# Patient Record
Sex: Male | Born: 1975 | Race: Black or African American | Hispanic: No | Marital: Married | Smoking: Former smoker
Health system: Southern US, Community
[De-identification: ages and names within clinical notes are randomized; demographics above are authoritative.]

## PROBLEM LIST (undated history)

## (undated) DIAGNOSIS — I10 Essential (primary) hypertension: Secondary | ICD-10-CM

## (undated) HISTORY — DX: Essential (primary) hypertension: I10

---

## 1995-11-23 HISTORY — PX: SPLENECTOMY: SUR1306

## 1995-11-23 HISTORY — PX: OTHER SURGICAL HISTORY: SHX169

## 2015-12-03 ENCOUNTER — Encounter: Payer: Self-pay | Admitting: *Deleted

## 2015-12-04 ENCOUNTER — Ambulatory Visit (INDEPENDENT_AMBULATORY_CARE_PROVIDER_SITE_OTHER): Payer: BLUE CROSS/BLUE SHIELD | Admitting: Cardiology

## 2015-12-04 ENCOUNTER — Encounter: Payer: Self-pay | Admitting: *Deleted

## 2015-12-04 ENCOUNTER — Encounter: Payer: Self-pay | Admitting: Cardiology

## 2015-12-04 VITALS — BP 116/76 | HR 94 | Ht 71.0 in | Wt 225.2 lb

## 2015-12-04 DIAGNOSIS — I10 Essential (primary) hypertension: Secondary | ICD-10-CM

## 2015-12-04 DIAGNOSIS — R9431 Abnormal electrocardiogram [ECG] [EKG]: Secondary | ICD-10-CM

## 2015-12-04 DIAGNOSIS — R072 Precordial pain: Secondary | ICD-10-CM

## 2015-12-04 DIAGNOSIS — F17201 Nicotine dependence, unspecified, in remission: Secondary | ICD-10-CM

## 2015-12-04 NOTE — Patient Instructions (Signed)
Your physician recommends that you schedule a follow-up appointment TO BE DETERMINED AFTER TESTING  Your physician has requested that you have a stress echocardiogram. For further information please visit [email or web address]. Please follow instruction sheet as given.  Your physician recommends that you continue on your current medications as directed. Please refer to the Current Medication list given to you today.  Thank you for choosing [facility]!!

## 2015-12-04 NOTE — Progress Notes (Signed)
Cardiology Office Note  Date: 12/04/2015   ID: Marcus Reed, DOB 07-28-1976, MRN [id number]  PCP: Ignatius Specking., MD  Consulting Cardiologist: Nona Dell, MD   Chief Complaint  Patient presents with  . Chest Pain    History of Present Illness: Marcus Reed is a 40 y.o. male referred for cardiology consultation by Dr. Doreen Beam. We discussed his history. He tells me that he presented for a routine health visit, and that while he was waiting in the waiting room to be seen at [facility], began to experience a sharp right-sided chest discomfort that became more intense. He was evaluated with an ECG (reviewed below), and referred for cardiology consultation. He tells been that these symptoms eventually went away on their own while he was there in the office. He has had no prior chest discomfort similar to this, and subsequently has not experienced any chest pain. 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 community [employer] during the daytime. He reports no functional limitations at baseline.  He reports having a history of hypertension for the last 4 years. He did have increased voltage on his ECG. No obvious family history of cardiomyopathy or sudden cardiac death. He states that his father has hypertension.  He does have a previous history of tobacco use but has stopped smoking.  He reports compliance with his medications which we reviewed.  Past Medical History  Diagnosis Date  . Essential hypertension     Past Surgical History  Procedure Laterality Date  . Splenectomy  1997  . Left humerus surgery  1997    Current Outpatient Prescriptions  Medication Sig Dispense Refill  . aspirin-acetaminophen-caffeine (EXCEDRIN MIGRAINE) 250-250-65 MG tablet Take 1 tablet by mouth as needed for headache.    . Azilsartan-Chlorthalidone (EDARBYCLOR) 40-25 MG TABS Take 1 tablet by mouth daily.     . DiphenhydrAMINE HCl (EQ ALLERGY RELIEF PO)  Take 1 tablet by mouth as needed.    . MULTIPLE VITAMIN PO Take 1 tablet by mouth daily.     No current facility-administered medications for this visit.   Allergies:  Review of patient's allergies indicates no known allergies.   Social History: The patient  reports that he has quit smoking. His smoking use included Cigarettes. He does not have any smokeless tobacco history on file. He reports that he drinks alcohol. He reports that he does not use illicit drugs.   Family History: The patient's family history includes Hypertension in his father.   ROS:  Please see the history of present illness. Otherwise, complete review of systems is positive for none.  All other systems are reviewed and negative.   Physical Exam: VS:  BP 116/76 mmHg  Pulse 94  Ht 5\' 11"  (1.803 m)  Wt 225 lb 3.2 oz (102.15 kg)  BMI 31.42 kg/m2  SpO2 95%, BMI Body mass index is 31.42 kg/(m^2).  Wt Readings from Last 3 Encounters:  12/04/15 225 lb 3.2 oz (102.15 kg)  11/28/15 226 lb (102.513 kg)    General: Well-developed male, appears comfortable at rest. HEENT: Conjunctiva and lids normal, oropharynx clear. Neck: Supple, no elevated JVP or carotid bruits, no thyromegaly. Lungs: Clear to auscultation, nonlabored breathing at rest. Cardiac: Regular rate and rhythm, no S3 or significant systolic murmur, no pericardial rub. Abdomen: Soft, nontender, bowel sounds present, no guarding or rebound. Extremities: No pitting edema, distal pulses 2+. Skin: Warm and dry. Musculoskeletal: No kyphosis. Neuropsychiatric: Alert and oriented  x3, affect grossly appropriate.  ECG: [facility] showed sinus rhythm with increased voltage and lead loss in V1 and V2  Assessment and Plan:  1. Episode of atypical chest pain, presently resolved. ECG is abnormal with increased voltage, although potentially related to hypertension. He has a prior history of tobacco use but has stopped smoking. He is referred  for further risk stratification. Plan is to obtain an exercise echocardiogram for further evaluation. If low risk, would focus on risk factor modification and keep follow-up with PCP.  2. Essential hypertension, pressure is well controlled today on current regimen. No changes were made.  Current medicines were reviewed with the patient today.   Orders Placed This Encounter  Procedures  . Echo stress    Disposition: Call with results.   Signed, Jonelle SidleSamuel G. Jaclynn Laumann, MD, [facility] 12/04/2015 3:18 PM    [facility] [address], [place], [place] [address] Phone: [phone]; Fax: [phone]

## 2015-12-19 ENCOUNTER — Ambulatory Visit (HOSPITAL_COMMUNITY)
Admission: RE | Admit: 2015-12-19 | Discharge: 2015-12-19 | Disposition: A | Discharge status: Home / Self Care | Payer: BLUE CROSS/BLUE SHIELD | Source: Ambulatory Visit | Attending: Cardiology | Admitting: Cardiology

## 2015-12-19 DIAGNOSIS — R9431 Abnormal electrocardiogram [ECG] [EKG]: Secondary | ICD-10-CM | POA: Diagnosis present

## 2015-12-19 DIAGNOSIS — R072 Precordial pain: Secondary | ICD-10-CM | POA: Diagnosis not present

## 2015-12-19 LAB — ECHOCARDIOGRAM STRESS TEST
CHL CUP RESTING HR STRESS: 78 {beats}/min
CSEPEDS: 0 s
Estimated workload: 10.1 METS
Exercise duration (min): 9 min
MPHR: 181 {beats}/min
Peak HR: 164 {beats}/min
Percent HR: 90 %
RPE: 9

## 2015-12-24 ENCOUNTER — Telehealth: Payer: Self-pay | Admitting: *Deleted

## 2015-12-24 NOTE — Telephone Encounter (Signed)
Patient informed. 

## 2015-12-24 NOTE — Telephone Encounter (Signed)
-----   Message from Jonelle Sidle, MD sent at 12/22/2015  8:09 AM EST ----- Reviewed report. Please let him know that the stress test was normal, arguing against significant obstructive CAD. No further cardiac testing is anticipated at this time. He should keep follow-up with Dr. Sherril Croon.

## 2016-03-26 ENCOUNTER — Encounter: Payer: Self-pay | Admitting: Vascular Surgery

## 2016-03-31 ENCOUNTER — Ambulatory Visit (INDEPENDENT_AMBULATORY_CARE_PROVIDER_SITE_OTHER): Payer: BLUE CROSS/BLUE SHIELD | Admitting: Vascular Surgery

## 2016-03-31 ENCOUNTER — Encounter: Payer: Self-pay | Admitting: Vascular Surgery

## 2016-03-31 VITALS — BP 154/88 | HR 79 | Temp 97.2°F | Resp 16 | Ht 71.5 in | Wt 218.0 lb

## 2016-03-31 DIAGNOSIS — R2232 Localized swelling, mass and lump, left upper limb: Secondary | ICD-10-CM | POA: Diagnosis not present
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
## 2016-04-01 ENCOUNTER — Encounter: Payer: Self-pay | Admitting: Nurse Practitioner

## 2016-04-06 NOTE — Addendum Note (Signed)
Addended by: Melodye PedMANESS-HARRISON, CHANDA C on: 04/06/2016 11:08 AM   Modules accepted: Orders

## 2016-04-07 ENCOUNTER — Telehealth: Payer: Self-pay | Admitting: Vascular Surgery

## 2016-04-07 NOTE — Telephone Encounter (Signed)
Spoke with pt. CTA 04/15/16 11:45 am 315 - no solids 4 hrs prior. Follow up with Dr. Darrick PennaFields 04/15/16 at 1:45 pm. Pt verbalized understanding.

## 2016-04-12 ENCOUNTER — Encounter: Payer: Self-pay | Admitting: Vascular Surgery

## 2016-04-15 ENCOUNTER — Ambulatory Visit (INDEPENDENT_AMBULATORY_CARE_PROVIDER_SITE_OTHER): Payer: BLUE CROSS/BLUE SHIELD | Admitting: Vascular Surgery

## 2016-04-15 ENCOUNTER — Encounter: Payer: Self-pay | Admitting: Vascular Surgery

## 2016-04-15 ENCOUNTER — Ambulatory Visit
Admission: RE | Admit: 2016-04-15 | Discharge: 2016-04-15 | Disposition: A | Discharge status: Home / Self Care | Payer: BLUE CROSS/BLUE SHIELD | Source: Ambulatory Visit | Attending: Vascular Surgery | Admitting: Vascular Surgery

## 2016-04-15 VITALS — BP 148/90 | HR 75 | Temp 97.7°F | Resp 18 | Ht 71.0 in | Wt 223.0 lb

## 2016-04-15 DIAGNOSIS — R2232 Localized swelling, mass and lump, left upper limb: Secondary | ICD-10-CM

## 2016-04-15 MED ORDER — IOPAMIDOL (ISOVUE-370) INJECTION 76%
80.0000 mL | Freq: Once | INTRAVENOUS | Status: AC | PRN
  Administered 2016-04-15: 80 mL via INTRAVENOUS
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
## 2018-05-17 ENCOUNTER — Other Ambulatory Visit (HOSPITAL_COMMUNITY): Payer: Self-pay | Admitting: Nurse Practitioner

## 2018-05-17 ENCOUNTER — Ambulatory Visit (HOSPITAL_COMMUNITY)
Admission: RE | Admit: 2018-05-17 | Discharge: 2018-05-17 | Disposition: A | Discharge status: Home / Self Care | Payer: BLUE CROSS/BLUE SHIELD | Source: Ambulatory Visit | Attending: Nurse Practitioner | Admitting: Nurse Practitioner

## 2018-05-17 DIAGNOSIS — M7989 Other specified soft tissue disorders: Secondary | ICD-10-CM | POA: Diagnosis not present

## 2018-05-17 DIAGNOSIS — M79604 Pain in right leg: Secondary | ICD-10-CM | POA: Insufficient documentation

## 2018-05-17 DIAGNOSIS — R600 Localized edema: Secondary | ICD-10-CM | POA: Diagnosis not present

## 2018-05-17 DIAGNOSIS — I824Y1 Acute embolism and thrombosis of unspecified deep veins of right proximal lower extremity: Secondary | ICD-10-CM | POA: Diagnosis present

## 2018-05-22 ENCOUNTER — Other Ambulatory Visit: Payer: Self-pay

## 2018-05-22 DIAGNOSIS — M79605 Pain in left leg: Secondary | ICD-10-CM

## 2018-06-26 ENCOUNTER — Encounter

## 2018-07-27 ENCOUNTER — Encounter: Payer: BLUE CROSS/BLUE SHIELD | Admitting: Vascular Surgery

## 2018-07-27 ENCOUNTER — Encounter (HOSPITAL_COMMUNITY): Payer: BLUE CROSS/BLUE SHIELD

## 2018-11-02 ENCOUNTER — Other Ambulatory Visit (HOSPITAL_COMMUNITY): Payer: Self-pay | Admitting: Internal Medicine

## 2018-11-02 DIAGNOSIS — I83813 Varicose veins of bilateral lower extremities with pain: Secondary | ICD-10-CM

## 2018-11-06 ENCOUNTER — Ambulatory Visit (HOSPITAL_COMMUNITY)
Admission: RE | Admit: 2018-11-06 | Discharge: 2018-11-06 | Disposition: A | Discharge status: Home / Self Care | Payer: BLUE CROSS/BLUE SHIELD | Source: Ambulatory Visit | Attending: Family | Admitting: Family

## 2018-11-06 DIAGNOSIS — I83813 Varicose veins of bilateral lower extremities with pain: Secondary | ICD-10-CM | POA: Diagnosis present

## 2018-11-07 ENCOUNTER — Encounter: Payer: Self-pay | Admitting: Nurse Practitioner

## 2018-12-05 ENCOUNTER — Other Ambulatory Visit: Payer: Self-pay

## 2018-12-05 ENCOUNTER — Ambulatory Visit (INDEPENDENT_AMBULATORY_CARE_PROVIDER_SITE_OTHER): Payer: BLUE CROSS/BLUE SHIELD | Admitting: Vascular Surgery

## 2018-12-05 ENCOUNTER — Encounter: Payer: Self-pay | Admitting: Vascular Surgery

## 2018-12-05 VITALS — BP 142/94 | HR 83 | Resp 18 | Ht 71.0 in | Wt 225.0 lb

## 2018-12-05 DIAGNOSIS — I872 Venous insufficiency (chronic) (peripheral): Secondary | ICD-10-CM | POA: Insufficient documentation
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
## 2018-12-11 DIAGNOSIS — I868 Varicose veins of other specified sites: Secondary | ICD-10-CM

## 2019-03-07 ENCOUNTER — Ambulatory Visit: Payer: BLUE CROSS/BLUE SHIELD | Admitting: Vascular Surgery

## 2019-05-29 ENCOUNTER — Telehealth (HOSPITAL_COMMUNITY): Payer: Self-pay | Admitting: Rehabilitation

## 2019-05-29 NOTE — Telephone Encounter (Signed)

## 2019-05-30 ENCOUNTER — Encounter: Payer: Self-pay | Admitting: Vascular Surgery

## 2019-05-30 ENCOUNTER — Ambulatory Visit: Payer: BC Managed Care – PPO | Admitting: Vascular Surgery

## 2019-05-30 ENCOUNTER — Other Ambulatory Visit: Payer: Self-pay

## 2019-05-30 VITALS — BP 137/90 | HR 83 | Temp 98.8°F | Resp 18 | Ht 71.0 in | Wt 224.0 lb

## 2019-05-30 DIAGNOSIS — I83813 Varicose veins of bilateral lower extremities with pain: Secondary | ICD-10-CM | POA: Diagnosis not present

## 2019-05-30 NOTE — Progress Notes (Signed)
Patient name: Marcus Reed MRN: [id number] DOB: 04/12/1976 Sex: male  HPI: Marcus Reed is a 43 y.o. male, returns today for follow-up for symptomatic varicose veins with pain and swelling in both lower extremities.  He was seen by my partner Dr. Carlis Abbott January 2020.  He was given a prescription for compression stockings at that time.  He has been compliant wearing the compression stockings and states he has even added Ace wraps on to them to try to get some relief from his symptoms.  His primary symptoms are heaviness fullness and aching and swelling in both lower extremities.  He states that the compression helps a little bit but does not completely resolve his symptoms.  Other medical problems include hypertension which has been stable.  Past Medical History:  Diagnosis Date  . Essential hypertension    Past Surgical History:  Procedure Laterality Date  . Left humerus surgery  1997  . SPLENECTOMY  1997    Family History  Problem Relation Age of Onset  . Hypertension Father     SOCIAL HISTORY: Social History   Socioeconomic History  . Marital status: Married    Spouse name: Not on file  . Number of children: Not on file  . Years of education: Not on file  . Highest education level: Not on file  Occupational History  . Not on file  Social Needs  . Financial resource strain: Not on file  . Food insecurity    Worry: Not on file    Inability: Not on file  . Transportation needs    Medical: Not on file    Non-medical: Not on file  Tobacco Use  . Smoking status: Former Smoker    Types: Cigarettes  . Smokeless tobacco: Never Used  Substance and Sexual Activity  . Alcohol use: Yes    Alcohol/week: 0.0 standard drinks    Comment: Occasional  . Drug use: No  . Sexual activity: Yes    Partners: Female  Lifestyle  . Physical activity    Days per week: Not on file    Minutes per session: Not on file  . Stress: Not on file  Relationships  . Social Herbalist  on phone: Not on file    Gets together: Not on file    Attends religious service: Not on file    Active member of club or organization: Not on file    Attends meetings of clubs or organizations: Not on file    Relationship status: Not on file  . Intimate partner violence    Fear of current or ex partner: Not on file    Emotionally abused: Not on file    Physically abused: Not on file    Forced sexual activity: Not on file  Other Topics Concern  . Not on file  Social History Narrative  . Not on file    No Known Allergies  Current Outpatient Medications  Medication Sig Dispense Refill  . Azilsartan-Chlorthalidone (EDARBYCLOR) 40-25 MG TABS Take 1 tablet by mouth daily.     . DiphenhydrAMINE HCl (EQ ALLERGY RELIEF PO) Take 1 tablet by mouth as needed.    Marland Kitchen aspirin-acetaminophen-caffeine (EXCEDRIN MIGRAINE) 250-250-65 MG tablet Take 1 tablet by mouth as needed for headache.     No current facility-administered medications for this visit.     ROS:   General:  No weight loss, Fever, chills  Cardiac: No recent episodes of chest pain/pressure, no shortness of breath at  rest.  No shortness of breath with exertion.  Denies history of atrial fibrillation or irregular heartbeat  Vascular: No history of rest pain in feet.  No history of claudication.  No history of non-healing ulcer, No history of DVT   Pulmonary: No home oxygen, no productive cough, no hemoptysis,  No asthma or wheezing   Physical Examination  Vitals:   05/30/19 1522  BP: 137/90  Pulse: 83  Resp: 18  Temp: 98.8 F (37.1 C)  TempSrc: Temporal  SpO2: 98%  Weight: 224 lb (101.6 kg)  Height: 5\' 11"  (1.803 m)    Body mass index is 31.24 kg/m.  General:  Alert and oriented, no acute distress HEENT: Normal Neck: No JVD Cardiac: Regular Rate and Rhythm  Skin: No rash, large clusters of varicosities diffusely through the right thigh and upper inner calf right leg.  Similar findings left leg although not as  large.       Extremity Pulses:  2+ radial, brachial, femoral, dorsalis pedis, posterior tibial pulses bilaterally Musculoskeletal: No deformity trace pedal and pretibial edema laterally  Neurologic: Upper and lower extremity motor 5/5 and symmetric  DATA:  I reviewed the patient's previous venous reflux exam from January 2020.  This showed diffuse reflux of the greater saphenous vein bilaterally with 5 mm great saphenous vein  ASSESSMENT: Symptomatic varicose veins bilateral lower extremities right greater than left.   PLAN: I believe the patient would benefit from laser ablation of the right greater saphenous vein with greater than 20 stab avulsions.  We will work on getting re-insurance preapproval for this.  At some point in the future we would also consider laser ablation stab avulsions for the left leg.  We will see how he does after his first procedure.  He will continue to wear his compression stockings meanwhile.   Fabienne Brunsharles Blayre Papania, MD [facility] [place] Office: [phone] Pager: [phone]

## 2019-06-07 ENCOUNTER — Other Ambulatory Visit: Payer: Self-pay | Admitting: *Deleted

## 2019-06-07 ENCOUNTER — Encounter: Payer: Self-pay | Admitting: Vascular Surgery

## 2019-06-07 DIAGNOSIS — I83813 Varicose veins of bilateral lower extremities with pain: Secondary | ICD-10-CM

## 2019-09-19 ENCOUNTER — Other Ambulatory Visit: Payer: Self-pay | Admitting: *Deleted

## 2019-09-19 MED ORDER — LORAZEPAM 1 MG PO TABS: 1.0000 mg | ORAL_TABLET | ORAL | 0 refills | Status: DC

## 2019-09-26 ENCOUNTER — Other Ambulatory Visit: Payer: Self-pay

## 2019-09-26 ENCOUNTER — Ambulatory Visit: Payer: BC Managed Care – PPO | Admitting: Vascular Surgery

## 2019-09-26 ENCOUNTER — Encounter: Payer: Self-pay | Admitting: Vascular Surgery

## 2019-09-26 VITALS — BP 131/87 | HR 83 | Temp 98.3°F | Resp 16 | Ht 71.0 in | Wt 224.0 lb

## 2019-09-26 DIAGNOSIS — I83811 Varicose veins of right lower extremities with pain: Secondary | ICD-10-CM | POA: Diagnosis not present

## 2019-09-26 HISTORY — PX: ENDOVENOUS ABLATION SAPHENOUS VEIN W/ LASER: SUR449

## 2019-09-26 NOTE — Progress Notes (Signed)
     Laser Ablation Procedure    Date: 09/26/2019   Dion Body DOB:07-24-1976  Consent signed: Yes    Surgeon: Ruta Hinds MD   Procedure: Laser Ablation: right Greater Saphenous Vein  BP 131/87 (BP Location: Left Arm, Patient Position: Sitting, Cuff Size: Large)   Pulse 83   Temp 98.3 F (36.8 C) (Temporal)   Resp 16   Ht 5\' 11"  (1.803 m)   Wt 224 lb (101.6 kg)   SpO2 99%   BMI 31.24 kg/m   Tumescent Anesthesia: 600 cc 0.9% NaCl with 50 cc Lidocaine HCL 1%  and 15 cc 8.4% NaHCO3  Local Anesthesia: 12 cc Lidocaine HCL and NaHCO3 (ratio 2:1)  15 watts continuous mode        Total energy: 3023 Joules   Total time: 3:21  Laser Fiber Ref. # [id number]      Lot # [id number]   Stab Phlebectomy: >20 Sites: Thigh and Calf  Patient tolerated procedure well  Notes: Patient wore face mask.  All staff members wore facial masks and facial shields/goggles.  Ativan 1 mg taken by Mr. Highfill on 09-26-2019 at 9:30 AM and at 11:15 AM.    Description of Procedure:  After marking the course of the secondary varicosities, the patient was placed on the operating table in the supine position, and the right leg was prepped and draped in sterile fashion.   Local anesthetic was administered and under ultrasound guidance the saphenous vein was accessed with a micro needle and guide wire; then the mirco puncture sheath was placed.  A guide wire was inserted saphenofemoral junction , followed by a 5 french sheath.  The position of the sheath and then the laser fiber below the junction was confirmed using the ultrasound.  Tumescent anesthesia was administered along the course of the saphenous vein using ultrasound guidance. The patient was placed in Trendelenburg position and protective laser glasses were placed on patient and staff, and the laser was fired at 15 watts continuous mode advancing 1-51mm/second for a total of 3023 joules.   For stab phlebectomies, local anesthetic was administered at the  previously marked varicosities, and tumescent anesthesia was administered around the vessels.  Greater than 20 stab wounds were made using the tip of an 11 blade. And using the vein hook, the phlebectomies were performed using a hemostat to avulse the varicosities.  Adequate hemostasis was achieved.     Steri strips were applied to the stab wounds and ABD pads and thigh high compression stockings were applied.  Ace wrap bandages were applied over the phlebectomy sites and at the top of the saphenofemoral junction. Blood loss was less than 15 cc.  Discharge instructions reviewed with patient and hardcopy of discharge instructions given to patient to take home. The patient ambulated out of the operating room having tolerated the procedure well.  Ruta Hinds, MD [facility] [place] Office: [phone]

## 2019-09-27 ENCOUNTER — Encounter: Payer: Self-pay | Admitting: Vascular Surgery

## 2019-10-02 ENCOUNTER — Telehealth (HOSPITAL_COMMUNITY): Payer: Self-pay | Admitting: *Deleted

## 2019-10-02 NOTE — Telephone Encounter (Signed)

## 2019-10-03 ENCOUNTER — Encounter: Payer: Self-pay | Admitting: Vascular Surgery

## 2019-10-03 ENCOUNTER — Other Ambulatory Visit: Payer: Self-pay

## 2019-10-03 ENCOUNTER — Ambulatory Visit (HOSPITAL_COMMUNITY)
Admission: RE | Admit: 2019-10-03 | Discharge: 2019-10-03 | Disposition: A | Discharge status: Home / Self Care | Payer: BC Managed Care – PPO | Source: Ambulatory Visit | Attending: Vascular Surgery | Admitting: Vascular Surgery

## 2019-10-03 ENCOUNTER — Ambulatory Visit (INDEPENDENT_AMBULATORY_CARE_PROVIDER_SITE_OTHER): Payer: Self-pay | Admitting: Vascular Surgery

## 2019-10-03 VITALS — BP 149/96 | HR 71 | Temp 98.2°F | Resp 14 | Ht 71.0 in | Wt 213.3 lb

## 2019-10-03 DIAGNOSIS — I83813 Varicose veins of bilateral lower extremities with pain: Secondary | ICD-10-CM | POA: Diagnosis not present

## 2019-10-03 NOTE — Progress Notes (Signed)
Patient is a 43 year old male who returns today for postoperative follow-up after laser ablation and stab avulsions of the right leg.  He still has some soreness and bruising around the area of treatment.  He has no shortness of breath or chest pain.  He has had no incisional drainage.  He does still have significant varicosities in the left lower extremity and is expressing interest in having laser ablation and stab avulsions of the left leg as well.  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  Physical exam:  Vitals:   10/03/19 1051  BP: (!) 149/96  Pulse: 71  Resp: 14  Temp: 98.2 F (36.8 C)  TempSrc: Temporal  SpO2: 98%  Weight: 213 lb 4.8 oz (96.8 kg)  Height: 5\' 11"  (1.803 m)    Healing incisions right leg mild ecchymosis tenderness especially over the lateral aspect of the leg  Left lower extremity multiple varicosities as depicted below    Data: Patient had a duplex ultrasound today which shows successful closure of the right greater saphenous vein no evidence of DVT   Also reviewed the patient's previous reflux exam from December 2019.  This shows diffuse reflux in the left greater saphenous vein with a vein diameter 5 to 6 mm.  Assessment: #1 status post laser ablation stab avulsions right leg slowly improving with still some residual pain and inflammation.  Patient was encouraged to elevate his leg use ice packs continue ibuprofen for a few more weeks.  Left lower extremity symptomatic varicose veins with pain and swelling and some changes of the skin consistent with varicose veins  Plan: #1 continue to elevate right leg continue ibuprofen and ice packs  2.  Patient will be scheduled for laser ablation and stab avulsions left leg for late December 2020.  Marcus Hinds, MD [facility] [place] Office: [phone]

## 2019-10-16 ENCOUNTER — Other Ambulatory Visit: Payer: Self-pay | Admitting: *Deleted

## 2019-10-16 DIAGNOSIS — I83813 Varicose veins of bilateral lower extremities with pain: Secondary | ICD-10-CM

## 2019-11-20 ENCOUNTER — Other Ambulatory Visit: Payer: Self-pay | Admitting: *Deleted

## 2019-11-20 MED ORDER — LORAZEPAM 1 MG PO TABS: 1.0000 mg | ORAL_TABLET | ORAL | 0 refills | Status: DC

## 2019-11-20 NOTE — Telephone Encounter (Signed)
Opened up incorrect tab for ordering medication.

## 2019-11-28 ENCOUNTER — Encounter: Payer: Self-pay | Admitting: Vascular Surgery

## 2019-11-28 ENCOUNTER — Ambulatory Visit: Payer: BC Managed Care – PPO | Admitting: Vascular Surgery

## 2019-11-28 ENCOUNTER — Other Ambulatory Visit: Payer: Self-pay

## 2019-11-28 VITALS — BP 145/92 | HR 92 | Temp 98.2°F | Resp 18 | Ht 71.0 in | Wt 223.0 lb

## 2019-11-28 DIAGNOSIS — I83812 Varicose veins of left lower extremities with pain: Secondary | ICD-10-CM | POA: Diagnosis not present

## 2019-11-28 HISTORY — PX: ENDOVENOUS ABLATION SAPHENOUS VEIN W/ LASER: SUR449

## 2019-11-28 NOTE — Progress Notes (Signed)
     Laser Ablation Procedure    Date: 11/28/2019   [name] DOB:1976-03-25  Consent signed: Yes    Surgeon: Fabienne Bruns MD   Procedure: Laser Ablation: left Greater Saphenous Vein  BP (!) 145/92 (BP Location: Right Arm, Patient Position: Sitting, Cuff Size: Normal)   Pulse 92   Temp 98.2 F (36.8 C) (Temporal)   Resp 18   Ht 5\' 11"  (1.803 m)   Wt 223 lb (101.2 kg)   SpO2 100%   BMI 31.10 kg/m   Tumescent Anesthesia: 600 cc 0.9% NaCl with 50 cc Lidocaine HCL 1%  and 15 cc 8.4% NaHCO3  Local Anesthesia: 18 cc Lidocaine HCL and NaHCO3 (ratio 2:1)  15 watts continuous mode        Total energy: 2871 Joules   Total time: 3:11  Laser Fiber Ref. #      Lot # [id number]   Stab Phlebectomy: >20 Sites: Thigh and Calf  Patient tolerated procedure well  Notes: Patient wore face mask.  All staff members wore facial masks and facial shields/goggles.  Mr. Finch took Ativan 1 mg on 11-28-2019 at 9:10 AM and at 10:30 AM.    Description of Procedure:  After marking the course of the secondary varicosities, the patient was placed on the operating table in the supine position, and the left leg was prepped and draped in sterile fashion.   Local anesthetic was administered and under ultrasound guidance the saphenous vein was accessed with a micro needle and guide wire; then the mirco puncture sheath was placed.  A guide wire was inserted saphenofemoral junction , followed by a 5 french sheath.  The position of the sheath and then the laser fiber below the junction was confirmed using the ultrasound.  Tumescent anesthesia was administered along the course of the saphenous vein using ultrasound guidance. The patient was placed in Trendelenburg position and protective laser glasses were placed on patient and staff, and the laser was fired at 15 watts continuous mode advancing 1-77mm/second for a total of 2871 joules.   For stab phlebectomies, local anesthetic was administered at the  previously marked varicosities, and tumescent anesthesia was administered around the vessels.  Greater than 20 stab wounds were made using the tip of an 11 blade. And using the vein hook, the phlebectomies were performed using a hemostat to avulse the varicosities.  Adequate hemostasis was achieved.     Steri strips were applied to the stab wounds and ABD pads and thigh high compression stockings were applied.  Ace wrap bandages were applied over the phlebectomy sites and at the top of the saphenofemoral junction. Blood loss was less than 15 cc.  Discharge instructions reviewed with patient and hardcopy of discharge instructions given to patient to take home. The patient ambulated out of the operating room having tolerated the procedure well.  3m, MD [facility] [place] Office: [phone]

## 2019-11-29 ENCOUNTER — Encounter: Payer: Self-pay | Admitting: Vascular Surgery

## 2019-12-05 ENCOUNTER — Ambulatory Visit (HOSPITAL_COMMUNITY): Payer: BC Managed Care – PPO

## 2019-12-05 ENCOUNTER — Ambulatory Visit: Payer: BC Managed Care – PPO | Admitting: Vascular Surgery

## 2019-12-11 ENCOUNTER — Telehealth (HOSPITAL_COMMUNITY): Payer: Self-pay

## 2019-12-11 NOTE — Telephone Encounter (Signed)

## 2019-12-12 ENCOUNTER — Ambulatory Visit (HOSPITAL_COMMUNITY)
Admission: RE | Admit: 2019-12-12 | Discharge: 2019-12-12 | Disposition: A | Discharge status: Home / Self Care | Payer: BC Managed Care – PPO | Source: Ambulatory Visit | Attending: Vascular Surgery | Admitting: Vascular Surgery

## 2019-12-12 ENCOUNTER — Encounter: Payer: Self-pay | Admitting: Vascular Surgery

## 2019-12-12 ENCOUNTER — Ambulatory Visit (INDEPENDENT_AMBULATORY_CARE_PROVIDER_SITE_OTHER): Payer: Self-pay | Admitting: Vascular Surgery

## 2019-12-12 ENCOUNTER — Other Ambulatory Visit: Payer: Self-pay

## 2019-12-12 VITALS — BP 137/91 | HR 87 | Temp 98.4°F | Resp 18 | Ht 71.0 in | Wt 223.0 lb

## 2019-12-12 DIAGNOSIS — I83813 Varicose veins of bilateral lower extremities with pain: Secondary | ICD-10-CM | POA: Diagnosis present

## 2019-12-12 NOTE — Progress Notes (Signed)
Patient is a 44 year old male who returns for follow-up today.  He underwent laser ablation of his greater saphenous September 26, 2019.  He also had greater than 20 stab phlebectomies during that setting.  He subsequently underwent laser ablation of his left greater saphenous vein greater than 20 stab phlebectomies November 28, 2019.  He returns today for postoperative follow-up.  States his left leg symptoms have improved.  He still complains of pain over the right lateral aspect of his calf fullness heaviness and aching which is not relieved by elevation or compression stockings in the right leg.  He does not have shortness of breath.  He has not had hemoptysis.  Past Medical History:  Diagnosis Date  . Essential hypertension     Past Surgical History:  Procedure Laterality Date  . ENDOVENOUS ABLATION SAPHENOUS VEIN W/ LASER Right 09/26/2019   endovenous laser ablation right greater saphenous vein and stab phlebectomy > 20 incisions right leg by Fabienne Bruns MD   . ENDOVENOUS ABLATION SAPHENOUS VEIN W/ LASER Left 11/28/2019   endovenous laser ablation left greater saphenous vein and stab phlebectomy > 20 incisions left leg by Fabienne Bruns MD   . Left humerus surgery  1997  . SPLENECTOMY  1997    Current Outpatient Medications on File Prior to Visit  Medication Sig Dispense Refill  . aspirin-acetaminophen-caffeine (EXCEDRIN MIGRAINE) 250-250-65 MG tablet Take 1 tablet by mouth as needed for headache.    . Azilsartan-Chlorthalidone (EDARBYCLOR) 40-25 MG TABS Take 1 tablet by mouth daily.     . DiphenhydrAMINE HCl (EQ ALLERGY RELIEF PO) Take 1 tablet by mouth as needed.    Marland Kitchen LORazepam (ATIVAN) 1 MG tablet Take 1 tablet (1 mg total) by mouth as directed. Take 1 tablet prior to leaving house on day of procedure.  Bring second tablet with you to the office. (Patient not taking: Reported on 12/12/2019) 2 tablet 0  . LORazepam (ATIVAN) 1 MG tablet Take 1 tablet (1 mg total) by mouth as directed.  Take 1 tablet 30 to 60 minutes prior to the procedure and bring second tablet in with you to office. (Patient not taking: Reported on 12/12/2019) 2 tablet 0   No current facility-administered medications on file prior to visit.    Physical exam:  Vitals:   12/12/19 1030  BP: (!) 137/91  Pulse: 87  Resp: 18  Temp: 98.4 F (36.9 C)  TempSrc: Temporal  SpO2: 99%  Weight: 223 lb (101.2 kg)  Height: 5\' 11"  (1.803 m)    Right lower extremity: Well-healed stab avulsion incisions I repeated portions of his ultrasound on his right leg with the SonoSite today which shows an anterior accessory vein which measures 5 mm in the proximal thigh tapering down to 3 mm in the area just above the knee.  Left lower extremity well-healed stab incisions  Data: Patient had a duplex ultrasound of both lower extremities today which showed good closure of the left greater saphenous vein within 7 mm of the saphenofemoral junction.  In the right leg he was noted to have an accessory anterior saphenous branch measuring 5 to 6 mm in diameter with reflux and communication to some varicosities on the lateral aspect of his right leg.   Assessment: Symptomatic varicose veins with pain.  Right leg still has persistent symptoms despite closure of his right greater saphenous vein and has a patent anterior accessory with reflux.  Left leg symptoms have now resolved.  Plan: I discussed with the patient today possible laser  ablation of the anterior accessory saphenous in his right leg for improvement of his symptoms.  He will continue to wear his compression stockings.  We will call him pending insurance approval for the procedure.  Ruta Hinds, MD [facility] [place] Office: [phone]

## 2019-12-14 ENCOUNTER — Encounter: Payer: Self-pay | Admitting: Vascular Surgery

## 2019-12-25 ENCOUNTER — Other Ambulatory Visit: Payer: Self-pay | Admitting: *Deleted

## 2019-12-25 DIAGNOSIS — I83811 Varicose veins of right lower extremities with pain: Secondary | ICD-10-CM

## 2019-12-25 MED ORDER — LORAZEPAM 1 MG PO TABS: ORAL_TABLET | ORAL | 0 refills | Status: DC

## 2019-12-27 ENCOUNTER — Telehealth (HOSPITAL_COMMUNITY): Payer: Self-pay

## 2019-12-27 NOTE — Telephone Encounter (Signed)

## 2019-12-28 ENCOUNTER — Ambulatory Visit: Payer: BC Managed Care – PPO | Admitting: Vascular Surgery

## 2019-12-28 ENCOUNTER — Encounter: Payer: Self-pay | Admitting: Vascular Surgery

## 2019-12-28 ENCOUNTER — Other Ambulatory Visit: Payer: Self-pay

## 2019-12-28 VITALS — BP 136/91 | HR 87 | Temp 98.4°F | Resp 16 | Ht 71.0 in | Wt 220.0 lb

## 2019-12-28 DIAGNOSIS — I83811 Varicose veins of right lower extremities with pain: Secondary | ICD-10-CM

## 2019-12-28 HISTORY — PX: ENDOVENOUS ABLATION SAPHENOUS VEIN W/ LASER: SUR449

## 2019-12-28 NOTE — Progress Notes (Signed)
     Laser Ablation Procedure    Date: 12/28/2019   Marcus Reed DOB:Jul 15, 1976  Consent signed: Yes    Surgeon: Fabienne Bruns MD   Procedure: Laser Ablation: right Greater Saphenous Vein( anterior accessory branch )  BP (!) 136/91 (BP Location: Right Arm, Patient Position: Sitting, Cuff Size: Normal)   Pulse 87   Temp 98.4 F (36.9 C) (Temporal)   Resp 16   Ht 5\' 11"  (1.803 m)   Wt 220 lb (99.8 kg)   SpO2 96%   BMI 30.68 kg/m   Tumescent Anesthesia: 200 cc 0.9% NaCl with 50 cc Lidocaine HCL 1%  and 15 cc 8.4% NaHCO3  Local Anesthesia: 3 cc Lidocaine HCL and NaHCO3 (ratio 2:1)  15 watts continuous mode        Total energy: 1577 Joules    Total time: 1:45  Laser Fiber Ref. #   LOT# [id number]      Patient tolerated procedure well  Notes: Patient wore face mask.  All staff members wore facial masks and facial shields/goggles.  Ativan 1 mg taken at 9:00 AM and Ativan 1 mg taken at 10:40 AM by [id number].    Description of Procedure:  After marking the course of the secondary varicosities, the patient was placed on the operating table in the supine position, and the right leg was prepped and draped in sterile fashion.   Local anesthetic was administered and under ultrasound guidance the saphenous vein was accessed with a micro needle and guide wire; then the mirco puncture sheath was placed.  A guide wire was inserted saphenofemoral junction , followed by a 5 french sheath.  The position of the sheath and then the laser fiber below the junction was confirmed using the ultrasound.  Tumescent anesthesia was administered along the course of the saphenous vein using ultrasound guidance. The patient was placed in Trendelenburg position and protective laser glasses were placed on patient and staff, and the laser was fired at 15 watts continuous mode advancing 1-43mm/second for a total of 1577 joules.      Steri strip was applied to the IV insertion site and ABD pads and thigh  high compression stockings were applied.  Ace wrap bandages were applied over the right thigh and at the top of the saphenofemoral junction. Blood loss was less than 15 cc.  Discharge instructions reviewed with patient and hardcopy of discharge instructions given to patient to take home. The patient ambulated out of the operating room having tolerated the procedure well.  3m, MD [facility] [place] Office: [phone]

## 2020-01-08 ENCOUNTER — Telehealth (HOSPITAL_COMMUNITY): Payer: Self-pay

## 2020-01-08 NOTE — Telephone Encounter (Signed)

## 2020-01-09 ENCOUNTER — Ambulatory Visit (HOSPITAL_COMMUNITY)
Admission: RE | Admit: 2020-01-09 | Discharge: 2020-01-09 | Disposition: A | Discharge status: Home / Self Care | Payer: BC Managed Care – PPO | Source: Ambulatory Visit | Attending: Vascular Surgery | Admitting: Vascular Surgery

## 2020-01-09 ENCOUNTER — Other Ambulatory Visit: Payer: Self-pay

## 2020-01-09 ENCOUNTER — Ambulatory Visit (INDEPENDENT_AMBULATORY_CARE_PROVIDER_SITE_OTHER): Payer: Self-pay | Admitting: Vascular Surgery

## 2020-01-09 ENCOUNTER — Encounter: Payer: Self-pay | Admitting: Vascular Surgery

## 2020-01-09 VITALS — BP 133/93 | HR 77 | Temp 97.9°F | Resp 18 | Ht 71.0 in | Wt 230.0 lb

## 2020-01-09 DIAGNOSIS — I83811 Varicose veins of right lower extremities with pain: Secondary | ICD-10-CM

## 2020-01-09 NOTE — Progress Notes (Signed)
Patient is a 44 year old male who returns today after laser ablation of an accessory saphenous in his right leg.  He has now undergone laser ablation of the right greater saphenous vein September 26, 2019 with stab avulsions.  He also underwent laser ablation of the left greater saphenous vein stab avulsions January 2021.  Unfortunately he still has persistent right lateral calf pain sometimes which radiates up into the right lateral and posterior thigh.  Physical exam:  Vitals:   01/09/20 1103  BP: (!) 133/93  Pulse: 77  Resp: 18  Temp: 97.9 F (36.6 C)  TempSrc: Temporal  SpO2: 98%  Weight: 230 lb (104.3 kg)  Height: 5\' 11"  (1.803 m)    Lower extremities no significant edema well-healed incisions  Data: Patient had duplex ultrasound today which shows successful closure of the accessory saphenous as well as persistent closure of the right greater saphenous.  Assessment: Patient status post successful laser ablation of left and right greater saphenous and right accessory saphenous with multiple stab avulsions bilaterally.  Patient still has some pain in his right lateral calf and posterior thigh.  He does not really complain of back pain.  However, he really has no other further venous interventions that could be performed to improve his symptoms.     I think at this point we should proceed to a musculoskeletal work-up.  Plan: Patient will follow up with on an as-needed basis.  He will continue to wear his compression stockings.  We will schedule him an outpatient follow-up visit with sports medicine to see if they can further evaluate his right leg pain.  [place], MD [facility] [place] Office: [phone]

## 2020-01-11 ENCOUNTER — Ambulatory Visit: Payer: BC Managed Care – PPO | Admitting: Family Medicine

## 2020-01-14 ENCOUNTER — Other Ambulatory Visit: Payer: Self-pay

## 2020-01-14 ENCOUNTER — Ambulatory Visit: Payer: Self-pay

## 2020-01-14 ENCOUNTER — Ambulatory Visit (INDEPENDENT_AMBULATORY_CARE_PROVIDER_SITE_OTHER): Payer: BC Managed Care – PPO | Admitting: Family Medicine

## 2020-01-14 ENCOUNTER — Encounter: Payer: Self-pay | Admitting: Family Medicine

## 2020-01-14 VITALS — BP 150/100 | Ht 71.0 in | Wt 225.0 lb

## 2020-01-14 DIAGNOSIS — M79661 Pain in right lower leg: Secondary | ICD-10-CM | POA: Diagnosis not present

## 2020-01-14 DIAGNOSIS — M79604 Pain in right leg: Secondary | ICD-10-CM | POA: Diagnosis not present

## 2020-01-14 NOTE — Patient Instructions (Signed)
Your exam and ultrasound are reassuring. There's no evidence of a high grade muscle or tendon issue.  You do not have a stress fracture of your fibula in the area of pain. Your exam does not indicate this is coming from your back or this is compartment syndrome. Given your pain is in the area of the peroneal muscles I'd recommend continuing with the compression stockings and also doing the home exercises for this muscle group every day as directed. Follow up with me in about 6 weeks - if this is the issue, you should expect at least 50-75% improvement in your symptoms with the rehab exercises.

## 2020-01-14 NOTE — Assessment & Plan Note (Signed)
Unclear etiology of pain. [place] and exam reassuring with no evidence of a high grade muscle or tendon issue, no stress fx of fibula noted, no signs of compartment syndrome. -Continue with the compression stockings -Given handout for home exercises to do daily to focus on peroneus muscle group. -Follow up in about 6 weeks -Consider MRI of tib-fib if pain not improved during follow-up

## 2020-01-14 NOTE — Progress Notes (Signed)
   HPI  CC: Leg pain  R leg pain: 44 year old male who reports that he had knee surgery in 2019 for meniscal tear.  States that after that time he developed right lateral calf pain.  He was sent to vascular surgeon to have this further evaluated. He had laser ablation of the right greater saphenous vein 09/26/19.  He also underwent laser ablation of the left greater saphenous vein 11/2019.  Patient still reporting pain and itching over right lateral lower leg.  He was sent here by the vascular surgeon to rule out musculoskeletal cause of pain.   Patient states the pain is worse when he walks.  He states that he will often feel weak.  Describes the pain as an aching pain.  He states that occasionally the pain will radiate to his right hip but often stays over the lateral portion of his lower leg.  Denies any pain in his knee or his lower back.  He states that he has been using compression stockings as well as has previously tried ibuprofen.  He states he will ice it occasionally but this also does not seem to help.  No numbness or tingling.  See HPI and/or previous note for associated ROS.  Objective: BP (!) 150/100   Ht 5\' 11"  (1.803 m)   Wt 225 lb (102.1 kg)   BMI 31.38 kg/m  Gen: NAD, well groomed, a/o x3, normal affect.  CV: Well-perfused. Warm.  Resp: Non-labored.  Neuro: Sensation intact throughout. No gross coordination deficits.  Gait: unremarkable stride without signs of limp or balance issues.  R lower leg: Minimal TTP over peroneus longus and peroneus brevis on right. Inspection was negative for erythema, ecchymosis, and effusion. Some scars present over lateral portion of lower leg. No obvious bony abnormalities or signs of osteophyte development. Palpation yielded no asymmetric warmth; No patellar tenderness; No patellar crepitus. Patellar and quadriceps tendons unremarkable, and no tenderness of the pes anserine bursa. No obvious Baker's cyst development. ROM normal in flexion  (135 degrees) and extension (0 degrees). Normal hamstring and quadriceps strength. Neurovascularly intact bilaterally.  Negative hop test.  of RLE/fibula: No stress fractures noted of fibula. No masses noted. Multiple collateral vessels noted throughout exam. No abnormality noted of peroneus longus or brevis.   Assessment and plan:  Lower extremity pain, lateral, right Unclear etiology of pain. [place] and exam reassuring with no evidence of a high grade muscle or tendon issue, no stress fx of fibula noted, no signs of compartment syndrome. -Continue with the compression stockings -Given handout for home exercises to do daily to focus on peroneus muscle group. -Follow up in about 6 weeks -Consider MRI of tib-fib if pain not improved during follow-up   US Markesha Hannig, DO PGY-3, [facility]  01/14/2020 10:20 AM

## 2020-02-25 ENCOUNTER — Ambulatory Visit: Payer: BC Managed Care – PPO | Admitting: Family Medicine

## 2021-02-23 ENCOUNTER — Other Ambulatory Visit: Payer: Self-pay

## 2021-02-23 ENCOUNTER — Ambulatory Visit (INDEPENDENT_AMBULATORY_CARE_PROVIDER_SITE_OTHER): Payer: BC Managed Care – PPO | Admitting: Family Medicine

## 2021-02-23 VITALS — BP 142/100 | Ht 71.0 in | Wt 210.0 lb

## 2021-02-23 DIAGNOSIS — M79604 Pain in right leg: Secondary | ICD-10-CM | POA: Diagnosis not present

## 2021-02-23 NOTE — Patient Instructions (Signed)
We will go ahead with an MRI of your lumbar spine. Follow up with me (in person or virtual) in a no charge visit to go over results and next steps. If this is normal or inconclusive next step would be to see neurology.

## 2021-02-24 ENCOUNTER — Encounter: Payer: Self-pay | Admitting: Family Medicine

## 2021-02-24 NOTE — Progress Notes (Signed)
PCP: Orvilla Cornwall C  Subjective:   HPI: Patient is a 45 y.o. male here for right leg pain.  01/14/20: 45 year old male who reports that he had knee surgery in 2019 for meniscal tear.  States that after that time he developed right lateral calf pain.  He was sent to vascular surgeon to have this further evaluated. He had laser ablation of the right greater saphenous vein 09/26/19.  He also underwent laser ablation of the left greater saphenous vein 11/2019.  Patient still reporting pain and itching over right lateral lower leg.  He was sent here by the vascular surgeon to rule out musculoskeletal cause of pain.   Patient states the pain is worse when he walks.  He states that he will often feel weak.  Describes the pain as an aching pain.  He states that occasionally the pain will radiate to his right hip but often stays over the lateral portion of his lower leg.  Denies any pain in his knee or his lower back.  He states that he has been using compression stockings as well as has previously tried ibuprofen.  He states he will ice it occasionally but this also does not seem to help.  No numbness or tingling.  02/23/21: Patient reports he's continued to have the pain in right lower leg with associated feeling of weakness. Still radiating up leg laterally to right buttock, this has progressed some since last year. No numbness or tingling. Taking tramadol without much benefit. Seems to be worse at work when walking/standing on concrete. No back pain. No bowel/bladder dysfunction.  Past Medical History:  Diagnosis Date  . Essential hypertension     Current Outpatient Medications on File Prior to Visit  Medication Sig Dispense Refill  . amLODipine (NORVASC) 10 MG tablet Take 1 tablet by mouth daily.    Marland Kitchen aspirin-acetaminophen-caffeine (EXCEDRIN MIGRAINE) 250-250-65 MG tablet Take 1 tablet by mouth as needed for headache.    . Azilsartan-Chlorthalidone (EDARBYCLOR) 40-25 MG TABS Take 1 tablet  by mouth daily.     . candesartan-hydrochlorothiazide (ATACAND HCT) 32-12.5 MG tablet Take 1 tablet by mouth daily.    . traMADol (ULTRAM) 50 MG tablet Take 50 mg by mouth every 4 (four) hours as needed.     No current facility-administered medications on file prior to visit.    Past Surgical History:  Procedure Laterality Date  . ENDOVENOUS ABLATION SAPHENOUS VEIN W/ LASER Right 09/26/2019   endovenous laser ablation right greater saphenous vein and stab phlebectomy > 20 incisions right leg by Fabienne Bruns MD   . ENDOVENOUS ABLATION SAPHENOUS VEIN W/ LASER Left 11/28/2019   endovenous laser ablation left greater saphenous vein and stab phlebectomy > 20 incisions left leg by Fabienne Bruns MD   . ENDOVENOUS ABLATION SAPHENOUS VEIN W/ LASER Right 12/28/2019   endovenous laser ablation right anterior accessory branch of right greater saphenous vein by Fabienne Bruns MD    . Left humerus surgery  1997  . SPLENECTOMY  1997    No Known Allergies  Social History   Socioeconomic History  . Marital status: Married    Spouse name: Not on file  . Number of children: Not on file  . Years of education: Not on file  . Highest education level: Not on file  Occupational History  . Not on file  Tobacco Use  . Smoking status: Former Smoker    Types: Cigarettes  . Smokeless tobacco: Never Used  Substance and Sexual Activity  . Alcohol  use: Yes    Alcohol/week: 0.0 standard drinks    Comment: Occasional  . Drug use: No  . Sexual activity: Yes    Partners: Female  Other Topics Concern  . Not on file  Social History Narrative  . Not on file   Social Determinants of Health   Financial Resource Strain: Not on file  Food Insecurity: Not on file  Transportation Needs: Not on file  Physical Activity: Not on file  Stress: Not on file  Social Connections: Not on file  Intimate Partner Violence: Not on file    Family History  Problem Relation Age of Onset  . Hypertension Father      BP (!) 142/100   Ht 5\' 11"  (1.803 m)   Wt 210 lb (95.3 kg)   BMI 29.29 kg/m   [facility] 02/23/2021  Frequency of aerobic exercise (# of days/week) 2  Average time in minutes 30  Frequency of strengthening activities (# of days/week) 2    No flowsheet data found.  Review of Systems: See HPI above.     Objective:  Physical Exam:  Gen: NAD, comfortable in exam room  Back: No gross deformity, scoliosis. No paraspinal TTP.  No midline or bony TTP. FROM. Strength LEs 5/5 all muscle groups.   Trace MSRs in patellar and achilles tendons, equal bilaterally. Negative SLRs. Sensation intact to light touch bilaterally.  Right leg: No deformity, swelling, bruising.  Well healed scars lateral lower leg. FROM with 5/5 strength ankle motions, knee flexion and extension. Tenderness to palpation lateral lower leg. NVI distally.  Assessment & Plan:  1. Right leg pain - Patient has been struggling with this for several years.  Not improved despite partial meniscectomy of knee, vascular workup and ablations.  Doppler ultrasounds, musculoskeletal ultrasounds without cause of his pain.  This does radiate up to buttock.  Possible this is referred pain from low back - will go ahead with MRI of lumbar spine.  Consider neurology referral if this does not show cause of his pain.

## 2021-03-11 ENCOUNTER — Ambulatory Visit
Admission: RE | Admit: 2021-03-11 | Discharge: 2021-03-11 | Disposition: A | Discharge status: Home / Self Care | Payer: BC Managed Care – PPO | Source: Ambulatory Visit | Attending: Family Medicine | Admitting: Family Medicine

## 2021-03-11 DIAGNOSIS — M79604 Pain in right leg: Secondary | ICD-10-CM

## 2021-03-17 ENCOUNTER — Other Ambulatory Visit: Payer: Self-pay

## 2021-03-17 DIAGNOSIS — M79604 Pain in right leg: Secondary | ICD-10-CM

## 2021-03-18 ENCOUNTER — Other Ambulatory Visit: Payer: Self-pay | Admitting: Family Medicine

## 2021-03-18 DIAGNOSIS — M79604 Pain in right leg: Secondary | ICD-10-CM

## 2021-03-20 ENCOUNTER — Other Ambulatory Visit: Payer: Self-pay

## 2021-03-20 ENCOUNTER — Ambulatory Visit
Admission: RE | Admit: 2021-03-20 | Discharge: 2021-03-20 | Disposition: A | Discharge status: Home / Self Care | Payer: BC Managed Care – PPO | Source: Ambulatory Visit | Attending: Family Medicine | Admitting: Family Medicine

## 2021-03-20 DIAGNOSIS — M79604 Pain in right leg: Secondary | ICD-10-CM

## 2021-03-20 MED ORDER — METHYLPREDNISOLONE ACETATE 40 MG/ML INJ SUSP (RADIOLOG
80.0000 mg | Freq: Once | INTRAMUSCULAR | Status: AC
  Administered 2021-03-20: 80 mg via EPIDURAL

## 2021-03-20 MED ORDER — IOPAMIDOL (ISOVUE-M 200) INJECTION 41%
1.0000 mL | Freq: Once | INTRAMUSCULAR | Status: AC
  Administered 2021-03-20: 1 mL via EPIDURAL

## 2021-03-20 NOTE — Discharge Instr - Other Orders (Signed)
Pt BP elevated. Pt reports he was very nervous for this procedure and forgot to take his BP med. Instructed pt to go home, take his meds as soon as possible and to recheck this afternoon to ensure his BP is coming down. If not, pt should contact his PCP today. Pt verbalized understanding

## 2021-03-20 NOTE — Discharge Instructions (Signed)

## 2021-03-30 ENCOUNTER — Other Ambulatory Visit: Payer: Self-pay

## 2021-03-30 DIAGNOSIS — M79604 Pain in right leg: Secondary | ICD-10-CM

## 2021-03-30 NOTE — Progress Notes (Signed)
Pt called with update regarding lower back ESI. Pain came back the sunday night after his injection, and he is still having pain even after a week.   Per Dr. Pearletha Forge- we will repeat lumbar ESI. If that one doesn't help, he may have to get a 3rd before possible neurosurgeon referral.  Pt is in agreement with the plan.

## 2021-03-31 ENCOUNTER — Other Ambulatory Visit: Payer: Self-pay | Admitting: Family Medicine

## 2021-03-31 DIAGNOSIS — M79604 Pain in right leg: Secondary | ICD-10-CM

## 2021-04-06 ENCOUNTER — Other Ambulatory Visit: Payer: Self-pay

## 2021-04-06 ENCOUNTER — Ambulatory Visit
Admission: RE | Admit: 2021-04-06 | Discharge: 2021-04-06 | Disposition: A | Discharge status: Home / Self Care | Payer: BC Managed Care – PPO | Source: Ambulatory Visit | Attending: Family Medicine | Admitting: Family Medicine

## 2021-04-06 DIAGNOSIS — M79604 Pain in right leg: Secondary | ICD-10-CM

## 2021-04-06 MED ORDER — IOPAMIDOL (ISOVUE-M 200) INJECTION 41%
1.0000 mL | Freq: Once | INTRAMUSCULAR | Status: AC
  Administered 2021-04-06: 1 mL via EPIDURAL

## 2021-04-06 MED ORDER — METHYLPREDNISOLONE ACETATE 40 MG/ML INJ SUSP (RADIOLOG
80.0000 mg | Freq: Once | INTRAMUSCULAR | Status: AC
  Administered 2021-04-06: 80 mg via EPIDURAL

## 2021-04-06 NOTE — Discharge Instructions (Signed)

## 2021-04-06 NOTE — Discharge Instr - Other Orders (Signed)
Pts BP is high, see vitals flowsheet. Per pt he did not take his BP medication but verbalized understanding that he should go and take his BP medication after discharge. Pt reports he feels "fine".

## 2022-07-08 IMAGING — XA Imaging study
2 series · 2 of 2 positions shown · non-contrast
Comparison: none

CLINICAL DATA: Lumbosacral spondylosis without myelopathy with
radiculopathy. Right lateral lower leg pain. Lower lumbar
spondylosis with bilateral L5 pars defects and right-sided
impingement. No significant prove mint after right L4-L5
interlaminar epidural injection 17 days ago. Repeat injection
requested.

[Series 1: ortho adipose · 1 of 1 slices shown (1 of 2)]
[im 1/1]
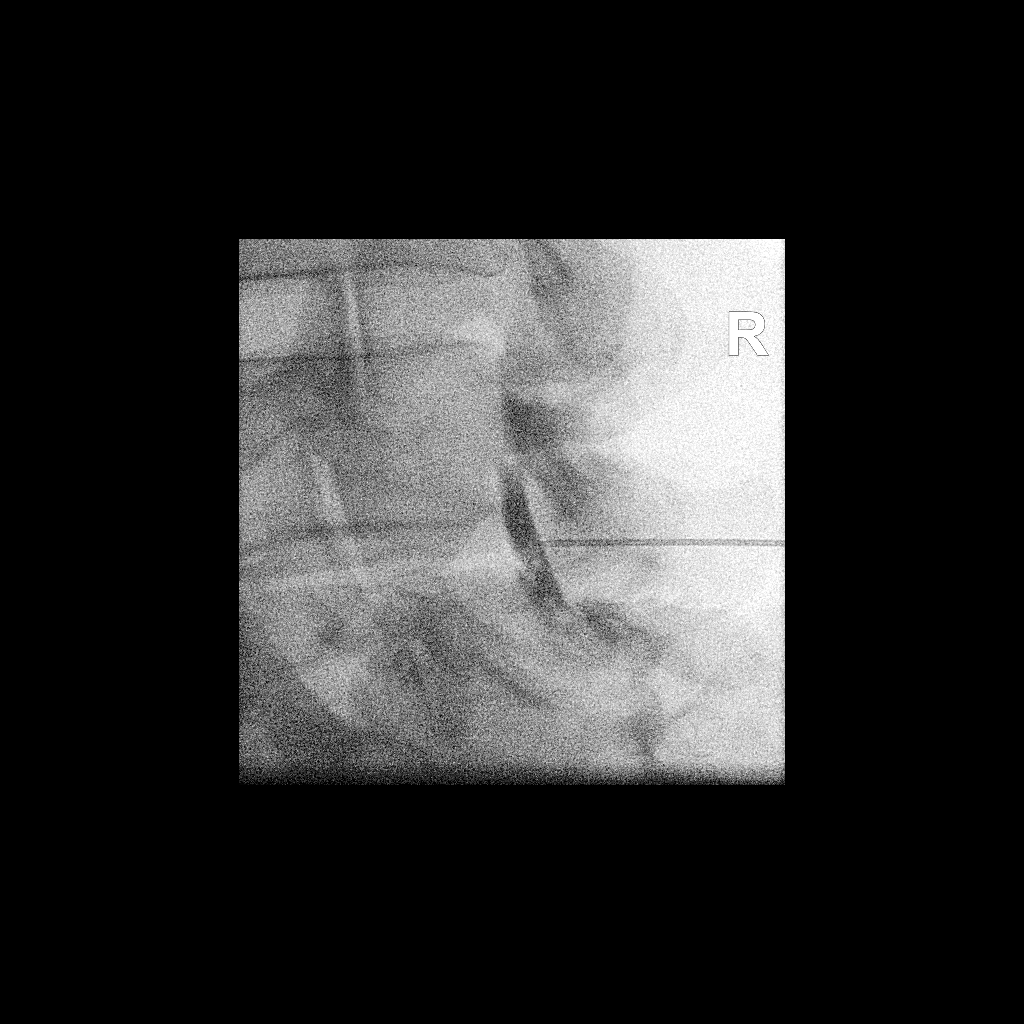

[Series 2: ortho adipose · 1 of 1 slices shown (2 of 2)]
[im 1/1]
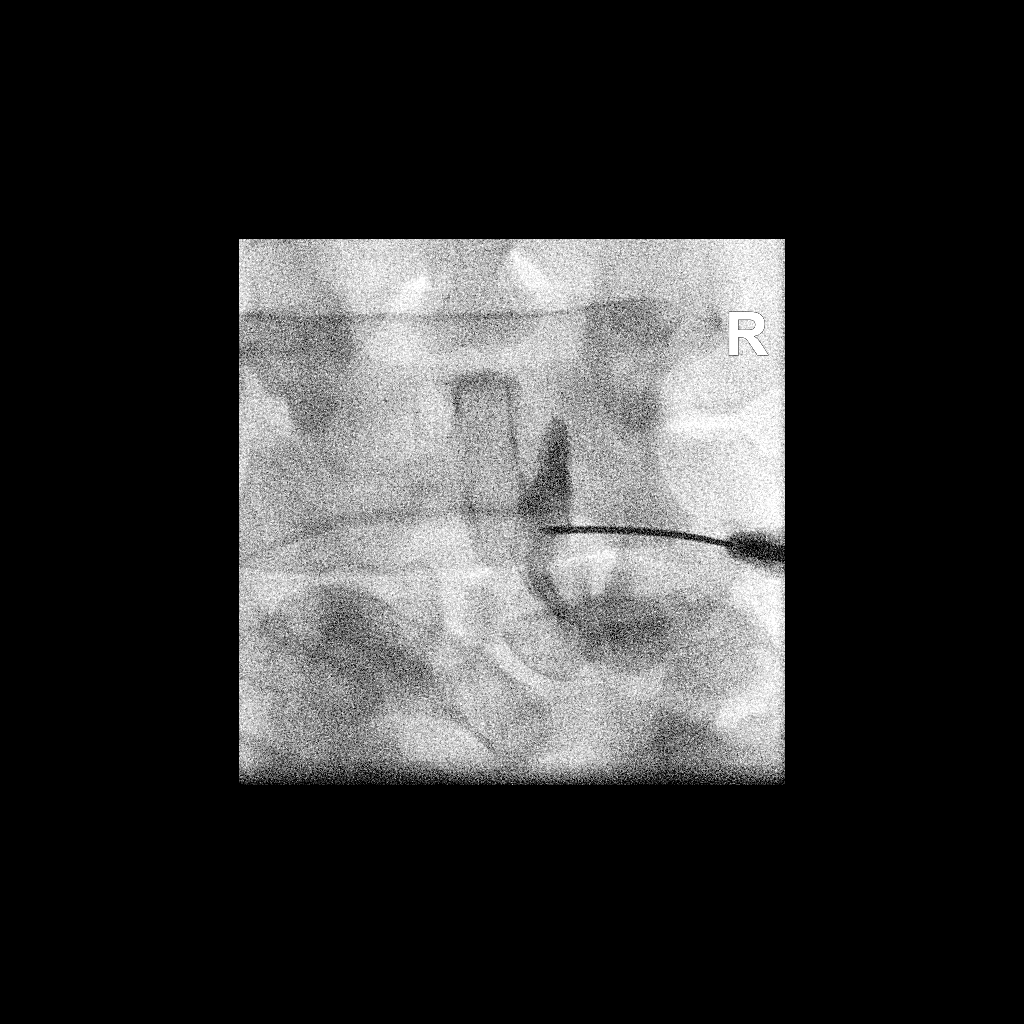

[2 of 2 positions shown; findings below may reference images not displayed]

FLUOROSCOPY TIME:  Radiation Exposure Index (as provided by the
fluoroscopic device): 2.7 mGy

Fluoroscopy Time:  18 seconds

Number of Acquired Images:  0

PROCEDURE:
The procedure, risks, benefits, and alternatives were explained to
the patient. Questions regarding the procedure were encouraged and
answered. The patient understands and consents to the procedure.

LUMBAR EPIDURAL INJECTION:

An interlaminar approach was performed on the right at L4-L5. The
overlying skin was cleansed and anesthetized. A 3.5 inch 20 gauge
epidural needle was advanced using loss-of-resistance technique.

DIAGNOSTIC EPIDURAL INJECTION:

Injection of Isovue-M 200 shows a good epidural pattern with spread
above and below the level of needle placement, primarily on the
right. No vascular opacification is seen.

THERAPEUTIC EPIDURAL INJECTION:

80 mg of Depo-Medrol mixed with 3 mL of 1% lidocaine were instilled.
The procedure was well-tolerated, and the patient was discharged
thirty minutes following the injection in good condition.

COMPLICATIONS:
None immediate.
IMPRESSION: Technically successful interlaminar epidural injection on the right
at L4-L5.

## 2023-07-19 ENCOUNTER — Encounter (HOSPITAL_COMMUNITY): Payer: BC Managed Care – PPO

## 2023-07-19 ENCOUNTER — Other Ambulatory Visit: Payer: Self-pay | Admitting: *Deleted

## 2023-07-19 DIAGNOSIS — I872 Venous insufficiency (chronic) (peripheral): Secondary | ICD-10-CM

## 2023-07-20 ENCOUNTER — Ambulatory Visit (HOSPITAL_COMMUNITY)
Admission: RE | Admit: 2023-07-20 | Discharge: 2023-07-20 | Disposition: A | Discharge status: Home / Self Care | Payer: BC Managed Care – PPO | Source: Ambulatory Visit | Attending: Vascular Surgery | Admitting: Vascular Surgery

## 2023-07-20 ENCOUNTER — Ambulatory Visit (INDEPENDENT_AMBULATORY_CARE_PROVIDER_SITE_OTHER): Payer: BC Managed Care – PPO | Admitting: Vascular Surgery

## 2023-07-20 ENCOUNTER — Encounter: Payer: Self-pay | Admitting: Vascular Surgery

## 2023-07-20 VITALS — BP 141/101 | HR 70 | Temp 98.0°F | Resp 18 | Ht 70.0 in | Wt 207.0 lb

## 2023-07-20 DIAGNOSIS — I83811 Varicose veins of right lower extremities with pain: Secondary | ICD-10-CM | POA: Diagnosis not present

## 2023-07-20 DIAGNOSIS — I872 Venous insufficiency (chronic) (peripheral): Secondary | ICD-10-CM | POA: Diagnosis not present

## 2023-07-20 NOTE — Progress Notes (Signed)
ASSESSMENT & PLAN   CHRONIC VENOUS INSUFFICIENCY: This patient has previously had a superficial venous reflux disease ablated.  He has 2 areas of telangiectasias in his posterior right thigh which have bled and I think the best option for these is sclerotherapy.  He had 4 bleeding episodes.  The most recent episode was 1 week ago.  We will try to get him precertified for sclerotherapy in the near future.  We have discussed how to address the bleeding if it occurs again.  He knows to hold digital pressure over the area of bleeding and to lay flat and elevate his leg.  We have also discussed conservative measures including elevation, compression, the need to avoid prolonged sitting and standing, and exercise.  REASON FOR CONSULT:    Bleeding varicose veins right thigh.  The consult is requested by Orvilla Cornwall.   HPI:   Marcus Reed is a 47 y.o. male who is undergone multiple previous venous procedures.  In November 2020 he had laser ablation of the right great saphenous vein and stab phlebectomies.  In January 2021 he had laser ablation of the left great saphenous vein and stab phlebectomies.  In February of that year he had laser ablation of the right anterior accessory saphenous vein.  He comes in today because of for bleeding episodes from a varicosity in his right posterior thigh.  On my history, the patient has had 4 bleeding episodes from some small telangiectasias in his posterior right thigh.  The most recent episode was a week ago.  He was able to control this with pressure.  He denies significant aching pain and heaviness in his legs although he is on his feet all day at work.  He has not been wearing compression stockings.  Past Medical History:  Diagnosis Date   Essential hypertension     Family History  Problem Relation Age of Onset   Hypertension Father     SOCIAL HISTORY: Social History   Tobacco Use   Smoking status: Former    Types: Cigarettes   Smokeless  tobacco: Never  Substance Use Topics   Alcohol use: Yes    Alcohol/week: 0.0 standard drinks of alcohol    Comment: Occasional    No Known Allergies  Current Outpatient Medications  Medication Sig Dispense Refill   Azilsartan-Chlorthalidone (EDARBYCLOR) 40-25 MG TABS Take 1 tablet by mouth daily.      candesartan-hydrochlorothiazide (ATACAND HCT) 32-12.5 MG tablet Take 1 tablet by mouth daily.     amLODipine (NORVASC) 10 MG tablet Take 1 tablet by mouth daily. (Patient not taking: Reported on 07/20/2023)     aspirin-acetaminophen-caffeine (EXCEDRIN MIGRAINE) 250-250-65 MG tablet Take 1 tablet by mouth as needed for headache. (Patient not taking: Reported on 07/20/2023)     traMADol (ULTRAM) 50 MG tablet Take 50 mg by mouth every 4 (four) hours as needed. (Patient not taking: Reported on 07/20/2023)     No current facility-administered medications for this visit.    REVIEW OF SYSTEMS:  [X]  denotes positive finding, [ ]  denotes negative finding Cardiac  Comments:  Chest pain or chest pressure:    Shortness of breath upon exertion:    Short of breath when lying flat:    Irregular heart rhythm:        Vascular    Pain in calf, thigh, or hip brought on by ambulation:    Pain in feet at night that wakes you up from your sleep:     Blood clot in  your veins:    Leg swelling:         Pulmonary    Oxygen at home:    Productive cough:     Wheezing:         Neurologic    Sudden weakness in arms or legs:     Sudden numbness in arms or legs:     Sudden onset of difficulty speaking or slurred speech:    Temporary loss of vision in one eye:     Problems with dizziness:         Gastrointestinal    Blood in stool:     Vomited blood:         Genitourinary    Burning when urinating:     Blood in urine:        Psychiatric    Major depression:         Hematologic    Bleeding problems:    Problems with blood clotting too easily:        Skin    Rashes or ulcers:         Constitutional    Fever or chills:    -  PHYSICAL EXAM:   Vitals:   07/20/23 1415  BP: (!) 141/101  Pulse: 70  Resp: 18  Temp: 98 F (36.7 C)  TempSrc: Temporal  SpO2: 99%  Weight: 207 lb (93.9 kg)  Height: 5\' 10"  (1.778 m)   Body mass index is 29.7 kg/m. GENERAL: The patient is a well-nourished male, in no acute distress. The vital signs are documented above. CARDIAC: There is a regular rate and rhythm.  VASCULAR: He has palpable pedal pulses. He has multiple telangiectasias of both legs.  However there are 2 prominent areas in the posterior right thigh which are the areas that have bled.   I did look at his right great saphenous vein myself with the SonoSite.  There is an area of the great saphenous vein which recanalize distally.  This is dilated in the proximal calf is very small in the distal thigh and therefore I do not think is contributing to venous hypertension in the thigh where the telangiectasias have bled.  Thus I do not think addressing this would have any impact on his risk of bleeding.  Great saphenous vein down to the distal thigh is ablated.  PULMONARY: There is good air exchange bilaterally without wheezing or rales. ABDOMEN: Soft and non-tender with normal pitched bowel sounds.  MUSCULOSKELETAL: There are no major deformities. NEUROLOGIC: No focal weakness or paresthesias are detected. SKIN: There are no ulcers or rashes noted. PSYCHIATRIC: The patient has a normal affect.  DATA:    VENOUS DUPLEX: I have independently interpreted his venous duplex scan today.  This was of the right lower extremity only.  There was no evidence of DVT.  There was deep venous reflux in the common femoral vein, femoral vein, and popliteal vein.  There was reflux at the saphenofemoral junction.  There was reflux in the great saphenous vein from the distal thigh of the proximal calf with diameters ranging from 3.4-4.9 mm.  There was reflux in the small saphenous vein in the  proximal calf with a diameter of 4 mm.  The results of the study are summarized in the diagram below.    Waverly [facility]

## 2023-07-21 ENCOUNTER — Encounter: Payer: BC Managed Care – PPO | Admitting: Vascular Surgery

## 2023-08-30 ENCOUNTER — Ambulatory Visit (INDEPENDENT_AMBULATORY_CARE_PROVIDER_SITE_OTHER): Payer: BC Managed Care – PPO

## 2023-08-30 DIAGNOSIS — I83891 Varicose veins of right lower extremities with other complications: Secondary | ICD-10-CM | POA: Diagnosis not present

## 2023-08-30 NOTE — Progress Notes (Signed)
  Treated pt's RLE small reticular veins with Asclera 1%, that have previously bled several times. Pt received a total of 2.5 mL (30 mg) of Asclera 1% administered with a 27 gauge butterfly needle. Pt tolerated well. He was put in thigh high 20-30 mm Hg compression hose after his treatment. Post treatment instructions given on handout and verbally. He will call if he has any questions/concerns that arise.

## 2024-01-16 ENCOUNTER — Other Ambulatory Visit (HOSPITAL_COMMUNITY): Payer: Self-pay | Admitting: Nephrology

## 2024-01-16 DIAGNOSIS — N189 Chronic kidney disease, unspecified: Secondary | ICD-10-CM

## 2024-01-24 ENCOUNTER — Ambulatory Visit (HOSPITAL_COMMUNITY)
Admission: RE | Admit: 2024-01-24 | Discharge: 2024-01-24 | Disposition: A | Discharge status: Home / Self Care | Payer: BC Managed Care – PPO | Source: Ambulatory Visit | Attending: Nephrology | Admitting: Nephrology

## 2024-01-24 DIAGNOSIS — N189 Chronic kidney disease, unspecified: Secondary | ICD-10-CM | POA: Diagnosis present

## 2024-01-24 DIAGNOSIS — D631 Anemia in chronic kidney disease: Secondary | ICD-10-CM | POA: Insufficient documentation

## 2024-02-06 ENCOUNTER — Encounter (HOSPITAL_COMMUNITY): Payer: BC Managed Care – PPO

## 2024-03-06 ENCOUNTER — Ambulatory Visit: Payer: BC Managed Care – PPO | Admitting: Primary Care

## 2024-03-06 ENCOUNTER — Telehealth: Payer: Self-pay | Admitting: Primary Care

## 2024-03-06 NOTE — Telephone Encounter (Signed)
 Spoke with patient regarding th e new appointment date and time with Marcus Hick, NP--03/06/24 appt cancelled due to provider being sick---new appointment   Thursday 05/31/24 at 9:30 am---patient placed on wait list

## 2024-03-29 ENCOUNTER — Emergency Department (HOSPITAL_COMMUNITY)

## 2024-03-29 ENCOUNTER — Other Ambulatory Visit: Payer: Self-pay

## 2024-03-29 ENCOUNTER — Encounter (HOSPITAL_COMMUNITY): Payer: Self-pay

## 2024-03-29 ENCOUNTER — Emergency Department (HOSPITAL_COMMUNITY)
Admission: EM | Admit: 2024-03-29 | Discharge: 2024-03-29 | Disposition: A | Discharge status: Home / Self Care | Attending: Emergency Medicine | Admitting: Emergency Medicine

## 2024-03-29 DIAGNOSIS — Z79899 Other long term (current) drug therapy: Secondary | ICD-10-CM | POA: Diagnosis not present

## 2024-03-29 DIAGNOSIS — Z7982 Long term (current) use of aspirin: Secondary | ICD-10-CM | POA: Diagnosis not present

## 2024-03-29 DIAGNOSIS — R519 Headache, unspecified: Secondary | ICD-10-CM | POA: Diagnosis not present

## 2024-03-29 DIAGNOSIS — L02213 Cutaneous abscess of chest wall: Secondary | ICD-10-CM | POA: Diagnosis present

## 2024-03-29 DIAGNOSIS — I10 Essential (primary) hypertension: Secondary | ICD-10-CM | POA: Insufficient documentation

## 2024-03-29 DIAGNOSIS — L02412 Cutaneous abscess of left axilla: Secondary | ICD-10-CM

## 2024-03-29 LAB — BASIC METABOLIC PANEL WITH GFR
Anion gap: 9 (ref 5–15)
BUN: 13 mg/dL (ref 6–20)
CO2: 29 mmol/L (ref 22–32)
Calcium: 9.8 mg/dL (ref 8.9–10.3)
Chloride: 94 mmol/L — ABNORMAL LOW (ref 98–111)
Creatinine, Ser: 0.91 mg/dL (ref 0.61–1.24)
GFR, Estimated: >60 mL/min (ref >60)
Glucose, Bld: 86 mg/dL (ref 70–99)
Potassium: 3.6 mmol/L (ref 3.5–5.1)
Sodium: 132 mmol/L — ABNORMAL LOW (ref 135–145)

## 2024-03-29 LAB — CBC WITH DIFFERENTIAL/PLATELET
Abs Immature Granulocytes: 0.03 10*3/uL (ref 0.00–0.07)
Basophils Absolute: 0.1 10*3/uL (ref 0.0–0.1)
Basophils Relative: 1 %
Eosinophils Absolute: 0.6 10*3/uL — ABNORMAL HIGH (ref 0.0–0.5)
Eosinophils Relative: 7 %
HCT: 52.9 % — ABNORMAL HIGH (ref 39.0–52.0)
Hemoglobin: 17.8 g/dL — ABNORMAL HIGH (ref 13.0–17.0)
Immature Granulocytes: 0 %
Lymphocytes Relative: 39 %
Lymphs Abs: 3.5 10*3/uL (ref 0.7–4.0)
MCH: 30.4 pg (ref 26.0–34.0)
MCHC: 33.6 g/dL (ref 30.0–36.0)
MCV: 90.4 fL (ref 80.0–100.0)
Monocytes Absolute: 0.8 10*3/uL (ref 0.1–1.0)
Monocytes Relative: 9 %
Neutro Abs: 3.9 10*3/uL (ref 1.7–7.7)
Neutrophils Relative %: 44 %
Platelets: 270 10*3/uL (ref 150–400)
RBC: 5.85 MIL/uL — ABNORMAL HIGH (ref 4.22–5.81)
RDW: 14.6 % (ref 11.5–15.5)
WBC: 8.9 10*3/uL (ref 4.0–10.5)
nRBC: 0 % (ref 0.0–0.2)

## 2024-03-29 MED ORDER — DOXYCYCLINE HYCLATE 100 MG PO CAPS: 100.0000 mg | ORAL_CAPSULE | Freq: Two times a day (BID) | ORAL | 0 refills | Status: AC

## 2024-03-29 MED ORDER — HYDRALAZINE HCL 25 MG PO TABS: 25.0000 mg | ORAL_TABLET | Freq: Two times a day (BID) | ORAL | 0 refills | Status: AC

## 2024-03-29 MED ORDER — ACETAMINOPHEN 500 MG PO TABS
1000.0000 mg | ORAL_TABLET | Freq: Once | ORAL | Status: AC
  Administered 2024-03-29: 1000 mg via ORAL
  Filled 2024-03-29: qty 2

## 2024-03-29 MED ORDER — HYDRALAZINE HCL 25 MG PO TABS
25.0000 mg | ORAL_TABLET | Freq: Once | ORAL | Status: AC
  Administered 2024-03-29: 25 mg via ORAL
  Filled 2024-03-29: qty 1

## 2024-03-29 MED ORDER — IOHEXOL 300 MG/ML  SOLN
75.0000 mL | Freq: Once | INTRAMUSCULAR | Status: AC | PRN
  Administered 2024-03-29: 75 mL via INTRAVENOUS

## 2024-03-29 NOTE — ED Triage Notes (Signed)
 Pt arrived via POV from [facility] for evaluation of large abscess to left chest wall. Pt reports abscess has worsened over last 2 weeks and ruptured last night. Pt reports he received a shot of Rocephin and they sent a prescription for an antibiotic to the pharmacy but reports he was told to go to the ER.

## 2024-03-29 NOTE — Discharge Instructions (Addendum)
 It was a pleasure taking care of you today.  Today we evaluated the abscess on your left chest wall under your arm pit.  Due to the abscess already being open and draining, there was no indication today for an incision and drainage.  This was double checked by the imaging that you had done, which showed that the abscess was superficial and not containing that much more fluid.  Today you will be discharged on a course of antibiotics, please take the antibiotics as prescribed and finish the full course.  Please return the emergency department or seek further medical care if you experience any following symptoms including but not limited to fever, chills, severe chest pain, shortness of breath, confusion.  While taking this antibiotic, please be aware that you are more prone to sunburn.  When going outside into direct sunlight please keep exposed skin covered.  Due to your elevated blood pressure today, we also gave you a dose of blood pressure medicine called hydralazine.  I have started you on this medication outpatient as well as he can pick it up at your pharmacy.  Please take as prescribed and follow-up with your primary care provider within 1 week for ongoing diagnosis and treatment of your hypertension as well as your left chest wall abscess.

## 2024-03-29 NOTE — ED Provider Notes (Signed)
 [place] EMERGENCY DEPARTMENT AT [facility] Provider Note   CSN: [id number] Arrival date & time: 03/29/24  1356     History  Chief Complaint  Patient presents with   Abscess    [name] is a 48 y.o. male who presents to the emergency department for a chief complaint of left chest wall abscess.  Patient states that earlier this morning he was seen in an internal medicine office and given a shot of Rocephin at approximately 12:30 PM.  He states he was also prescribed an antibiotic but cannot remember the name and believes it started with a B.  Patient states that the abscess has been there for approximately 2 weeks, and is unsure of what happened for it to start.  Denies open wounds, IV drug use, fever, or chills.  Patient believes it may have started out as a bite.  Patient states that he does have high blood pressure at home, with typical numbers ranging in the 140/90 region.  States that he is compliant with his outpatient therapy at home.   Abscess Associated symptoms: headaches   Associated symptoms: no fever and no vomiting        Home Medications Prior to Admission medications   Medication Sig Start Date End Date Taking? Authorizing Provider  chlorthalidone (HYGROTON) 25 MG tablet Take 25 mg by mouth daily. 01/20/24  Yes [provider]  amLODipine (NORVASC) 10 MG tablet Take 1 tablet by mouth daily. Patient not taking: Reported on 07/20/2023 01/06/21   [provider]  aspirin-acetaminophen -caffeine (EXCEDRIN MIGRAINE) 250-250-65 MG tablet Take 1 tablet by mouth as needed for headache. Patient not taking: Reported on 07/20/2023    [provider]  Azilsartan-Chlorthalidone (EDARBYCLOR) 40-25 MG TABS Take 1 tablet by mouth daily.     [provider]  candesartan-hydrochlorothiazide (ATACAND HCT) 32-12.5 MG tablet Take 1 tablet by mouth daily. 12/18/20   [provider]  traMADol (ULTRAM) 50 MG tablet Take 50 mg by mouth  every 4 (four) hours as needed. Patient not taking: Reported on 07/20/2023 02/13/21   [provider]      Allergies    Patient has no known allergies.    Review of Systems   Review of Systems  Constitutional:  Negative for chills and fever.  Respiratory:  Negative for cough and shortness of breath.   Cardiovascular:  Negative for chest pain.  Gastrointestinal:  Negative for vomiting.  Skin:  Positive for wound (Left chest wall). Negative for rash.  Neurological:  Positive for headaches. Negative for dizziness, seizures, syncope and weakness.  All other systems reviewed and are negative.   Physical Exam Updated Vital Signs BP (!) 172/126 (BP Location: Right Arm)   Pulse 69   Temp 97.8 F (36.6 C) (Temporal)   Resp 16   Ht 5\' 10"  (1.778 m)   Wt 93.9 kg   SpO2 100%   BMI 29.70 kg/m  Physical Exam Vitals and nursing note reviewed.  Constitutional:      General: He is awake. He is not in acute distress.    Appearance: Normal appearance. He is not ill-appearing or toxic-appearing.  HENT:     Head: Normocephalic and atraumatic.  Eyes:     General: No scleral icterus.    Extraocular Movements: Extraocular movements intact.  Pulmonary:     Effort: Pulmonary effort is normal.  Chest:     Chest wall: Tenderness present.       Comments: Abscess on L chest wall,  actively draining with purulent material at time of exam, surrounding area tender Musculoskeletal:     Right lower leg: No edema.     Left lower leg: No edema.  Skin:    General: Skin is warm and dry.     Findings: Lesion (L chest wall abscess, actively draining purulent material at time of exam) present.  Neurological:     General: No focal deficit present.     Mental Status: He is alert and oriented to person, place, and time.  Psychiatric:        Mood and Affect: Mood normal.        Behavior: Behavior normal. Behavior is cooperative.        Thought Content: Thought content normal.     ED Results /  Procedures / Treatments   Labs (all labs ordered are listed, but only abnormal results are displayed) Labs Reviewed - No data to display  EKG None  Radiology No results found.  Procedures Procedures    Medications Ordered in ED Medications - No data to display  ED Course/ Medical Decision Making/ A&P    Patient presents to the ED for concern of left chest wall abscess, this involves an extensive number of treatment options, and is a complaint that carries with it a high risk of complications and morbidity.  The differential diagnosis includes abscess, sepsis, lipoma, cyst, insect bite, etc.   Co morbidities that complicate the patient evaluation  None   Lab Tests:  I Ordered, and personally interpreted labs.  The pertinent results include:  CBC- RBC 5.5, hemoglobin 17.8, HCT 52.9, eosinophils 0.6   BMP-sodium 132, chloride 94   Imaging ordered:  CT chest ordered Findings significant for  Changes consistent with the known history of chest wall inflammatory  change. No definitive fluid collection is noted. No foreign body is  seen.    Questionable metallic foreign body within the right atrium and  ventricle. One-view chest may be helpful for further evaluation.    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 Recommend annual imaging  followup by CTA or MRA.       Medicines ordered and prescription drug management:  I ordered medication including doxycycline  for abscess, infection Also ordered hydralazine  for inpatient as well as outpatient management of hypertension.  Highest BP today 190/116 Reevaluation of the patient after these medicines showed that the patient stayed the same I have reviewed the patients home medicines and have made adjustments as needed   Test Considered:  Blood cultures, lactic acid -declined due to patient reporting no fever, no chills, vital signs stable, clinical suspicion for sepsis low   Critical  Interventions:  None   Problem List / ED Course:  Left chest wall abscess Left chest wall abscess actively draining at time of exam.  Patient states he was seen earlier as an internal medicine doctor's office.  He was given a shot of Rocephin at approximately 12:30 PM and then prescribed outpatient antibiotics which she has not picked up yet.  Denies fever or chills, vital signs stable.  Due to possibility of deeper involvement CT scan ordered. CT scan significant for superficial abscess as well as questionable metal in right atrium and right ventricle, dilated aorta, patient educated about CT scan results and will follow-up with PCP for ongoing diagnosis and treatment as well as reimaging Patient vital signs significant for increasing blood pressure, hydralazine  given today, as well as for outpatient management, patient instructed to follow-up with his primary care  provider for ongoing diagnosis and treatment for abscess as well as hypertension. Patient instructed about side effects of doxycycline  Patient updated on findings of CT scan, understand to follow-up with PCP for ongoing diagnosis and treatment, as well as reimaging Based on imaging physical exam and history today, there is no indication for a incision and drainage, the abscess is already draining.  Outpatient antibiotics prescribed, patient instructed to take these as prescribed and finish the entire course.   Due to elevated blood pressure today, patient given a dose of hydralazine  and also started on outpatient.  Patient instructed follow-up with his primary care provider for ongoing diagnosis and treatment of hypertension. Patient complaining of headache today in the emergency department. Return precautions discussed, as well as at home care of abscess   Reevaluation:  After the interventions noted above, I reevaluated the patient and found that they have :stayed the same   Social Determinants of  Health:  none   Dispostion:  After consideration of the diagnostic results and the patients response to treatment, I feel that the patent would benefit from discharge and follow-up with PCP within 1 week.  Please complete full course of antibiotics as prescribed, please take blood pressure medicine as prescribed as well, and follow-up with your PCP for ongoing diagnosis and treatment.  Please also make your PCP aware of your CT findings as instructed on the phone.   Click here for ABCD2, HEART and other calculatorsREFRESH Note before signing :1}                              Medical Decision Making Amount and/or Complexity of Data Reviewed Labs: ordered. Radiology: ordered.  Risk OTC drugs. Prescription drug management.          Final Clinical Impression(s) / ED Diagnoses Final diagnoses:  None    Rx / DC Orders ED Discharge Orders     None         Susanne Epps 03/29/24 2102    Early Glisson, MD 04/04/24 1158

## 2024-05-09 ENCOUNTER — Other Ambulatory Visit (HOSPITAL_COMMUNITY): Payer: Self-pay | Admitting: Nurse Practitioner

## 2024-05-09 DIAGNOSIS — S2619XA Other injury of heart without hemopericardium, initial encounter: Secondary | ICD-10-CM

## 2024-05-30 ENCOUNTER — Telehealth: Payer: Self-pay

## 2024-05-30 NOTE — Telephone Encounter (Signed)
Spoke with pt for precharting.

## 2024-05-30 NOTE — Telephone Encounter (Signed)
 ATC x1 for precharting

## 2024-05-31 ENCOUNTER — Ambulatory Visit: Admitting: Adult Health

## 2024-05-31 ENCOUNTER — Encounter: Payer: Self-pay | Admitting: Adult Health

## 2024-05-31 VITALS — BP 114/84 | HR 80 | Ht 71.0 in | Wt 210.8 lb

## 2024-05-31 DIAGNOSIS — F17210 Nicotine dependence, cigarettes, uncomplicated: Secondary | ICD-10-CM

## 2024-05-31 DIAGNOSIS — R0683 Snoring: Secondary | ICD-10-CM

## 2024-05-31 DIAGNOSIS — I10 Essential (primary) hypertension: Secondary | ICD-10-CM | POA: Diagnosis not present

## 2024-05-31 NOTE — Patient Instructions (Signed)
 Set up for home sleep study  Work on healthy weight loss  Do not drive is sleepy  Follow up in 6 weeks and As needed
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
## 2024-07-10 ENCOUNTER — Ambulatory Visit (HOSPITAL_BASED_OUTPATIENT_CLINIC_OR_DEPARTMENT_OTHER): Admitting: Adult Health

## 2024-09-10 ENCOUNTER — Ambulatory Visit: Attending: Cardiology | Admitting: Cardiology

## 2024-09-10 ENCOUNTER — Encounter: Payer: Self-pay | Admitting: Cardiology

## 2024-09-10 VITALS — BP 176/120 | HR 73 | Ht 71.0 in | Wt 210.0 lb

## 2024-09-10 DIAGNOSIS — Z136 Encounter for screening for cardiovascular disorders: Secondary | ICD-10-CM | POA: Diagnosis not present

## 2024-09-10 DIAGNOSIS — S2619XA Other injury of heart without hemopericardium, initial encounter: Secondary | ICD-10-CM

## 2024-09-10 DIAGNOSIS — I7121 Aneurysm of the ascending aorta, without rupture: Secondary | ICD-10-CM

## 2024-09-10 DIAGNOSIS — S2619XD Other injury of heart without hemopericardium, subsequent encounter: Secondary | ICD-10-CM | POA: Diagnosis not present

## 2024-09-10 NOTE — Patient Instructions (Addendum)
 Medication Instructions:   Continue all current medications.   Labwork:  none  Testing/Procedures:  Your physician has requested that you have an echocardiogram. Echocardiography is a painless test that uses sound waves to create images of your heart. It provides your doctor with information about the size and shape of your heart and how well your heart's chambers and valves are working. This procedure takes approximately one hour. There are no restrictions for this procedure. Please do NOT wear cologne, perfume, aftershave, or lotions (deodorant is allowed). Please arrive 15 minutes prior to your appointment time.  Please note: We ask at that you not bring children with you during ultrasound (echo/ vascular) testing. Due to room size and safety concerns, children are not allowed in the ultrasound rooms during exams. Our front office staff cannot provide observation of children in our lobby area while testing is being conducted. An adult accompanying a patient to their appointment will only be allowed in the ultrasound room at the discretion of the ultrasound technician under special circumstances. We apologize for any inconvenience. A chest x-ray takes a picture of the organs and structures inside the chest, including the heart, lungs, and blood vessels. This test can show several things, including, whether the heart is enlarges; whether fluid is building up in the lungs; and whether pacemaker / defibrillator leads are still in place.  Office will contact with results via phone, letter or mychart.     Follow-Up:  Pending   Any Other Special Instructions Will Be Listed Below (If Applicable).   If you need a refill on your cardiac medications before your next appointment, please call your pharmacy.
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
## 2024-09-25 ENCOUNTER — Encounter: Payer: Self-pay | Admitting: Cardiology

## 2024-09-25 ENCOUNTER — Ambulatory Visit: Attending: Cardiology

## 2024-10-08 ENCOUNTER — Ambulatory Visit

## 2024-10-08 ENCOUNTER — Ambulatory Visit
Admission: RE | Admit: 2024-10-08 | Discharge: 2024-10-08 | Disposition: A | Discharge status: Home / Self Care | Source: Ambulatory Visit | Attending: Nurse Practitioner | Admitting: Nurse Practitioner

## 2024-10-08 DIAGNOSIS — I7121 Aneurysm of the ascending aorta, without rupture: Secondary | ICD-10-CM | POA: Insufficient documentation

## 2024-10-08 DIAGNOSIS — S2619XA Other injury of heart without hemopericardium, initial encounter: Secondary | ICD-10-CM | POA: Insufficient documentation

## 2024-10-08 LAB — ECHOCARDIOGRAM COMPLETE
AR max vel: 3.74 cm2
AV Peak grad: 4.3 mmHg
Ao pk vel: 1.04 m/s
Area-P 1/2: 2.76 cm2
Calc EF: 57.3 %
S' Lateral: 3 cm
Single Plane A2C EF: 54.7 %
Single Plane A4C EF: 62.1 %

## 2024-11-08 ENCOUNTER — Ambulatory Visit: Payer: Self-pay | Admitting: Cardiology

## 2024-11-09 ENCOUNTER — Telehealth: Payer: Self-pay | Admitting: Cardiology

## 2024-11-09 NOTE — Telephone Encounter (Signed)
 Pt coming to office to sign medical release form for records from [facility].

## 2024-11-09 NOTE — Telephone Encounter (Signed)
 Patient stated he is returning staff call.

## 2024-11-28 ENCOUNTER — Ambulatory Visit: Admitting: Cardiology

## 2024-11-28 ENCOUNTER — Encounter: Payer: Self-pay | Admitting: Cardiology

## 2024-11-28 VITALS — Ht 71.0 in | Wt 210.0 lb

## 2024-11-28 DIAGNOSIS — S2619XD Other injury of heart without hemopericardium, subsequent encounter: Secondary | ICD-10-CM | POA: Diagnosis not present

## 2024-11-28 DIAGNOSIS — S2619XA Other injury of heart without hemopericardium, initial encounter: Secondary | ICD-10-CM

## 2024-11-28 NOTE — Addendum Note (Signed)
 Addended by: KENETH ROSINA BROCKS on: 11/28/2024 05:16 PM   Modules accepted: Orders
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
## 2024-11-28 NOTE — Addendum Note (Signed)
 Addended by: ALVAN CARRIER F on: 11/28/2024 05:06 PM   Modules accepted: Level of Service

## 2024-11-28 NOTE — Patient Instructions (Addendum)
 Medication Instructions:  Your physician recommends that you continue on your current medications as directed. Please refer to the Current Medication list given to you today.  *If you need a refill on your cardiac medications before your next appointment, please call your pharmacy*  Lab Work: BMET  If you have labs (blood work) drawn today and your tests are completely normal, you will receive your results only by: MyChart Message (if you have MyChart) OR A paper copy in the mail If you have any lab test that is abnormal or we need to change your treatment, we will call you to review the results.  Testing/Procedures: Cardiac CT  Follow-Up: At [facility], you and your health needs are our priority.  As part of our continuing mission to provide you with exceptional heart care, our providers are all part of one team.  This team includes your primary Cardiologist (physician) and Advanced Practice Providers or APPs (Physician Assistants and Nurse Practitioners) who all work together to provide you with the care you need, when you need it.  Your next appointment:    To Be Determined  Provider:   You may see Alvan Carrier, MD or one of the following Advanced Practice Providers on your designated Care Team:   Laymon Qua, PA-C  [place], [place] Olivia Pavy, [place]     We recommend signing up for the patient portal called MyChart.  Sign up information is provided on this After Visit Summary.  MyChart is used to connect with patients for Virtual Visits (Telemedicine).  Patients are able to view lab/test results, encounter notes, upcoming appointments, etc.  Non-urgent messages can be sent to your provider as well.   To learn more about what you can do with MyChart, go to [email or web address].   Other Instructions   Your cardiac CT will be scheduled at one of the below locations:   [facility] [address] [place], [place] [address] [phone]  OR   [facility] [address]  [place], [place] [address]  If scheduled at the [facility] at [address], please enter the parking lot using the [address] entrance and use the FREE valet service at the patient drop-off area. Enter the building and check-in with registration on the main floor.   Please follow these instructions carefully (unless otherwise directed):  An IV will be required for this test and Nitroglycerin will be given.  Hold all erectile dysfunction medications at least 3 days (72 hrs) prior to test. (Ie viagra, cialis, sildenafil, tadalafil, etc)   On the Night Before the Test: Be sure to Drink plenty of water. Do not consume any caffeinated/decaffeinated beverages or chocolate 12 hours prior to your test. Do not take any antihistamines 12 hours prior to your test.  On the Day of the Test: Drink plenty of water until 1 hour prior to the test. Do not eat any food 1 hour prior to test. You may take your regular medications prior to the test.  Take metoprolol  (Lopressor ) two hours prior to test. DO NOT TAKE CARVEDILOL (COREG) THE MORNING OF YOUR CT.  If you take Furosemide/Hydrochlorothiazide/Spironolactone/Chlorthalidone, please HOLD on the morning of the test. Patients who wear a continuous glucose monitor MUST remove the device prior to scanning.       After the Test: Drink plenty of water. After receiving IV contrast, you may experience a mild flushed feeling. This is normal. On occasion, you may experience  a mild rash up to 24 hours after the test. This is not dangerous. If this occurs, you can take Benadryl 25 mg, Zyrtec, Claritin, or Allegra and increase your fluid intake. (Patients taking Tikosyn should avoid Benadryl, and may take Zyrtec, Claritin, or Allegra) If you experience trouble breathing, this can be serious. If it is severe call 911 IMMEDIATELY. If it is mild, please call our  office.  We will call to schedule your test 2-4 weeks out understanding that some insurance companies will need an authorization prior to the service being performed.   For more information and frequently asked questions, please visit our website : [email or web address]  For non-scheduling related questions, please contact the cardiac imaging nurse navigator should you have any questions/concerns: Cardiac Imaging Nurse Navigators Direct Office Dial: [phone]   For scheduling needs, including cancellations and rescheduling, please call Brittany, [phone].

## 2024-11-29 MED ORDER — METOPROLOL TARTRATE 100 MG PO TABS: ORAL_TABLET | ORAL | 0 refills | Status: AC

## 2024-11-29 NOTE — Addendum Note (Signed)
 Addended by: KENETH ROSINA BROCKS on: 11/29/2024 07:38 AM   Modules accepted: Orders
